# Patient Record
Sex: Male | Born: 1990 | Race: White | Hispanic: No | Marital: Married | Smoking: Never smoker
Health system: Southern US, Community
[De-identification: ages and names within clinical notes are randomized; demographics above are authoritative.]

---

## 2017-10-31 ENCOUNTER — Emergency Department (HOSPITAL_BASED_OUTPATIENT_CLINIC_OR_DEPARTMENT_OTHER): Payer: BLUE CROSS/BLUE SHIELD

## 2017-10-31 ENCOUNTER — Emergency Department (HOSPITAL_BASED_OUTPATIENT_CLINIC_OR_DEPARTMENT_OTHER)
Admission: EM | Admit: 2017-10-31 | Discharge: 2017-10-31 | Disposition: A | Discharge status: Home / Self Care | Payer: BLUE CROSS/BLUE SHIELD | Attending: Emergency Medicine | Admitting: Emergency Medicine

## 2017-10-31 ENCOUNTER — Encounter (HOSPITAL_BASED_OUTPATIENT_CLINIC_OR_DEPARTMENT_OTHER): Payer: Self-pay | Admitting: *Deleted

## 2017-10-31 ENCOUNTER — Other Ambulatory Visit: Payer: Self-pay

## 2017-10-31 DIAGNOSIS — R079 Chest pain, unspecified: Secondary | ICD-10-CM | POA: Insufficient documentation

## 2017-10-31 LAB — CBC WITH DIFFERENTIAL/PLATELET
BASOS PCT: 1 %
Basophils Absolute: 0 10*3/uL (ref 0.0–0.1)
EOS ABS: 0.1 10*3/uL (ref 0.0–0.7)
EOS PCT: 1 %
HCT: 39.7 % (ref 39.0–52.0)
Hemoglobin: 13.6 g/dL (ref 13.0–17.0)
LYMPHS ABS: 2.2 10*3/uL (ref 0.7–4.0)
Lymphocytes Relative: 30 %
MCH: 26.7 pg (ref 26.0–34.0)
MCHC: 34.3 g/dL (ref 30.0–36.0)
MCV: 78 fL (ref 78.0–100.0)
MONO ABS: 0.9 10*3/uL (ref 0.1–1.0)
MONOS PCT: 13 %
Neutro Abs: 4 10*3/uL (ref 1.7–7.7)
Neutrophils Relative %: 55 %
PLATELETS: 255 10*3/uL (ref 150–400)
RBC: 5.09 MIL/uL (ref 4.22–5.81)
RDW: 12.8 % (ref 11.5–15.5)
WBC: 7.2 10*3/uL (ref 4.0–10.5)

## 2017-10-31 LAB — BASIC METABOLIC PANEL
Anion gap: 8 (ref 5–15)
BUN: 14 mg/dL (ref 6–20)
CALCIUM: 8.9 mg/dL (ref 8.9–10.3)
CO2: 25 mmol/L (ref 22–32)
Chloride: 103 mmol/L (ref 101–111)
Creatinine, Ser: 1.08 mg/dL (ref 0.61–1.24)
Glucose, Bld: 88 mg/dL (ref 65–99)
Potassium: 3.8 mmol/L (ref 3.5–5.1)
SODIUM: 136 mmol/L (ref 135–145)

## 2017-10-31 LAB — TROPONIN I

## 2017-10-31 MED ORDER — FAMOTIDINE 20 MG PO TABS: 20.0000 mg | ORAL_TABLET | Freq: Two times a day (BID) | ORAL | 0 refills | Status: AC

## 2017-10-31 NOTE — ED Triage Notes (Addendum)
Pt c/o left sided chest pain, dizziness " off and on" x 2 days

## 2017-10-31 NOTE — Discharge Instructions (Signed)
Please read and follow all provided instructions.  Your diagnoses today include:  1. Nonspecific chest pain     Tests performed today include:  An EKG of your heart  A chest x-ray  Cardiac enzymes - a blood test for heart muscle damage  Blood counts and electrolytes  Vital signs. See below for your results today.   Medications prescribed:   Pepcid (famotidine) - antihistamine  You can find this medication over-the-counter.   DO NOT exceed:   20mg  Pepcid every 12 hours  Take any prescribed medications only as directed.  Follow-up instructions: Please follow-up with your primary care provider in 1 week if symptoms are not improved.   Return instructions:  SEEK IMMEDIATE MEDICAL ATTENTION IF:  You have severe chest pain, especially if the pain is crushing or pressure-like and spreads to the arms, back, neck, or jaw, or if you have sweating, nausea (feeling sick to your stomach), or shortness of breath. THIS IS AN EMERGENCY. Don't wait to see if the pain will go away. Get medical help at once. Call 911 or [phone] (operator). DO NOT drive yourself to the hospital.   Your chest pain gets worse and does not go away with rest.   You have an attack of chest pain lasting longer than usual, despite rest and treatment with the medications your caregiver has prescribed.   You wake from sleep with chest pain or shortness of breath.  You feel dizzy or faint.  You have chest pain not typical of your usual pain for which you originally saw your caregiver.   You have any other emergent concerns regarding your health.  Additional Information: Chest pain comes from many different causes. Your caregiver has diagnosed you as having chest pain that is not specific for one problem, but does not require admission.  You are at low risk for an acute heart condition or other serious illness.   Your vital signs today were: BP (!) 107/54 (BP Location: Left Arm)    Pulse 62    Temp 98.2 F (36.8 C)  (Oral)    Resp 18    Ht 5\' 7"  (1.702 m)    Wt 90.7 kg (200 lb)    SpO2 99%    BMI 31.32 kg/m  If your blood pressure (BP) was elevated above 135/85 this visit, please have this repeated by your doctor within one month. --------------

## 2017-10-31 NOTE — ED Provider Notes (Signed)
[facility] EMERGENCY DEPARTMENT Provider Note   CSN: [id number] Arrival date & time: 10/31/17  1253     History   Chief Complaint Chief Complaint  Patient presents with  . Chest Pain    HPI Cody Decker is a 27 y.o. male.  Patient presents with complaint of intermittent episodes of chest pain over the past 2 days, acute onset, lasting several minutes prior to spontaneous resolution.  Patient describes the pain as a severe sharp pain in the left chest that lasts for several seconds followed by pressure that lasts for several minutes.  Symptoms occur at rest and at any time.  No association with food.  No symptoms like this in the past.  He has had some dizziness and shortness of breath when the pain is severe.  No syncope.  No fevers or cough.  No recent injuries but patient does lift weights and does cardio exercises.  He has not had symptoms with exercise.  No nausea, vomiting, diarrhea.  No abdominal pains.  No lower extremity swelling or history no history of hypertension, diabetes, high cholesterol, smoking.  Family history of coronary artery disease on father's side.  Nothing makes symptoms better or worse.      History reviewed. No pertinent past medical history.  There are no active problems to display for this patient.   History reviewed. No pertinent surgical history.     Home Medications    Prior to Admission medications   Not on File    Family History History reviewed. No pertinent family history.  Social History Social History   Tobacco Use  . Smoking status: Never Smoker  . Smokeless tobacco: Never Used  Substance Use Topics  . Alcohol use: No    Frequency: Never  . Drug use: No     Allergies   Patient has no known allergies.   Review of Systems Review of Systems  Constitutional: Negative for diaphoresis and fever.  Eyes: Negative for redness.  Respiratory: Positive for shortness of breath. Negative for cough.   Cardiovascular:  Positive for chest pain. Negative for palpitations and leg swelling.  Gastrointestinal: Negative for abdominal pain, nausea and vomiting.  Genitourinary: Negative for dysuria.  Musculoskeletal: Negative for back pain and neck pain.  Skin: Negative for rash.  Neurological: Positive for dizziness. Negative for syncope and light-headedness.  Psychiatric/Behavioral: The patient is not nervous/anxious.      Physical Exam Updated Vital Signs BP 121/84   Pulse 78   Temp 98.2 F (36.8 C) (Oral)   Resp 16   Ht 5\' 7"  (1.702 m)   Wt 90.7 kg (200 lb)   SpO2 99%   BMI 31.32 kg/m   Physical Exam  Constitutional: He appears well-developed and well-nourished.  HENT:  Head: Normocephalic and atraumatic.  Mouth/Throat: Mucous membranes are normal. Mucous membranes are not dry.  Eyes: Conjunctivae are normal.  Neck: Trachea normal and normal range of motion. Neck supple. Normal carotid pulses and no JVD present. No muscular tenderness present. Carotid bruit is not present. No tracheal deviation present.  Cardiovascular: Normal rate, regular rhythm, S1 normal, S2 normal, normal heart sounds and intact distal pulses. Exam reveals no distant heart sounds and no decreased pulses.  No murmur heard. Pulmonary/Chest: Effort normal and breath sounds normal. No respiratory distress. He has no wheezes. He has no rhonchi. He has no rales. He exhibits no tenderness.  No reproducible chest wall tenderness.  Abdominal: Soft. Normal aorta and bowel sounds are normal. There is no  tenderness. There is no rebound and no guarding.  Musculoskeletal: He exhibits no edema.       Right lower leg: He exhibits no tenderness and no edema.       Left lower leg: He exhibits no tenderness and no edema.  Neurological: He is alert.  Skin: Skin is warm and dry. He is not diaphoretic. No cyanosis. No pallor.  Psychiatric: He has a normal mood and affect.  Nursing note and vitals reviewed.    ED Treatments / Results   Labs (all labs ordered are listed, but only abnormal results are displayed) Labs Reviewed  CBC WITH DIFFERENTIAL/PLATELET  BASIC METABOLIC PANEL  TROPONIN I    EKG  EKG Interpretation  Date/Time:  Thursday October 31 2017 13:03:03 EST Ventricular Rate:  78 PR Interval:  144 QRS Duration: 94 QT Interval:  362 QTC Calculation: 412 R Axis:   -19 Text Interpretation:  Normal sinus rhythm Normal ECG Confirmed by Cathren LaineSteinl, Kevin ([id number]) on 10/31/2017 1:43:32 PM       Radiology Dg Chest 2 View  Result Date: 10/31/2017 CLINICAL DATA:  Chest pain. EXAM: CHEST  2 VIEW COMPARISON:  No prior. FINDINGS: Mediastinum and hilar structures normal. Lungs are clear. Heart size normal. No pleural effusion or pneumothorax. No acute bony abnormality. IMPRESSION: No acute cardiopulmonary disease. Electronically Signed   By: Maisie Fushomas  Register   On: 10/31/2017 13:43    Procedures Procedures (including critical care time)  Medications Ordered in ED Medications - No data to display   Initial Impression / Assessment and Plan / ED Course  I have reviewed the triage vital signs and the nursing notes.  Pertinent labs & imaging results that were available during my care of the patient were reviewed by me and considered in my medical decision making (see chart for details).     Patient seen and examined. Work-up initiated.  EKG reviewed.  No concern for ACS or PE.    Vital signs reviewed and are as follows: BP 121/84   Pulse 78   Temp 98.2 F (36.8 C) (Oral)   Resp 16   Ht 5\' 7"  (1.702 m)   Wt 90.7 kg (200 lb)   SpO2 99%   BMI 31.32 kg/m   4:16 PM patient updated on remainder of workup.  Lab work is reassuring.  PCP follow-up for recheck.  Plan is to treat empirically with Pepcid to see if this helps current episodic pain.    Encouraged return to the emergency department with worsening pain, especially if it lasts longer, worsening shortness of breath or trouble breathing, fever, new symptoms  or other concerns.  Patient verbalizes understanding and agrees with the plan.  The patient verbalized understanding and agreed.    Final Clinical Impressions(s) / ED Diagnoses   Final diagnoses:  Nonspecific chest pain   Patient with atypical short-lived episodes of left-sided chest pain.  Troponin negative.  EKG is nonischemic.  Chest x-ray reassuring without cardiomegaly, pneumonia, pneumothorax, or other concerning finding.  Pain is currently resolved.  May be GI or musculoskeletal in etiology.  Do not suspect pericarditis or myocarditis.  Patient is well-appearing, stable for discharge home with close PCP follow-up.  Return instructions as above.  ED Discharge Orders        Ordered    famotidine (PEPCID) 20 MG tablet  2 times daily     10/31/17 1557       Renne CriglerGeiple, Hugh, PA-C 10/31/17 1618    Cathren LaineSteinl, Kevin, MD 11/01/17 1259

## 2018-11-13 IMAGING — DX DG CHEST 2V
2 series · 2 of 2 positions shown · non-contrast
Comparison: No prior.

CLINICAL DATA: Chest pain.

EXAM:
CHEST  2 VIEW

[chest pa]
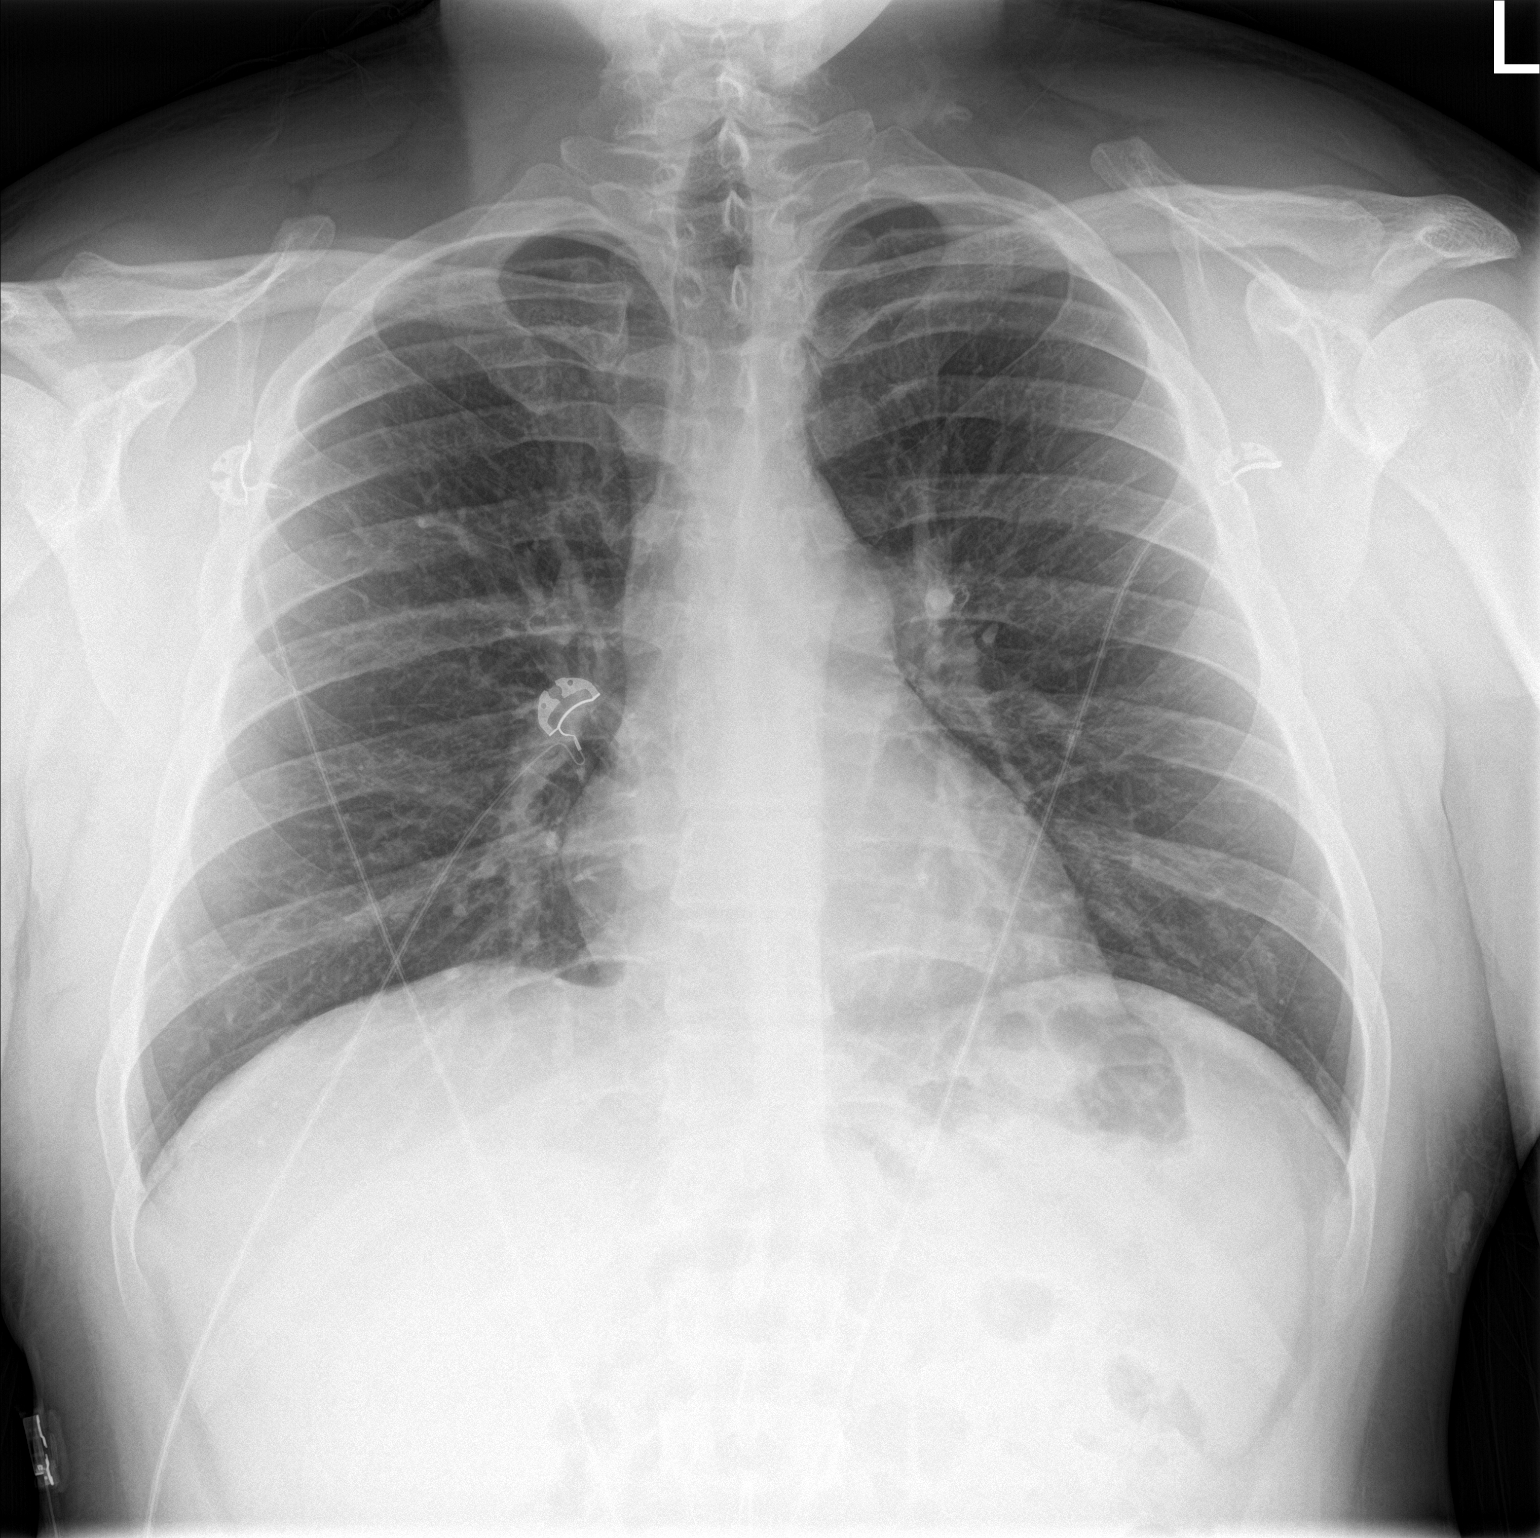

[chest lat]
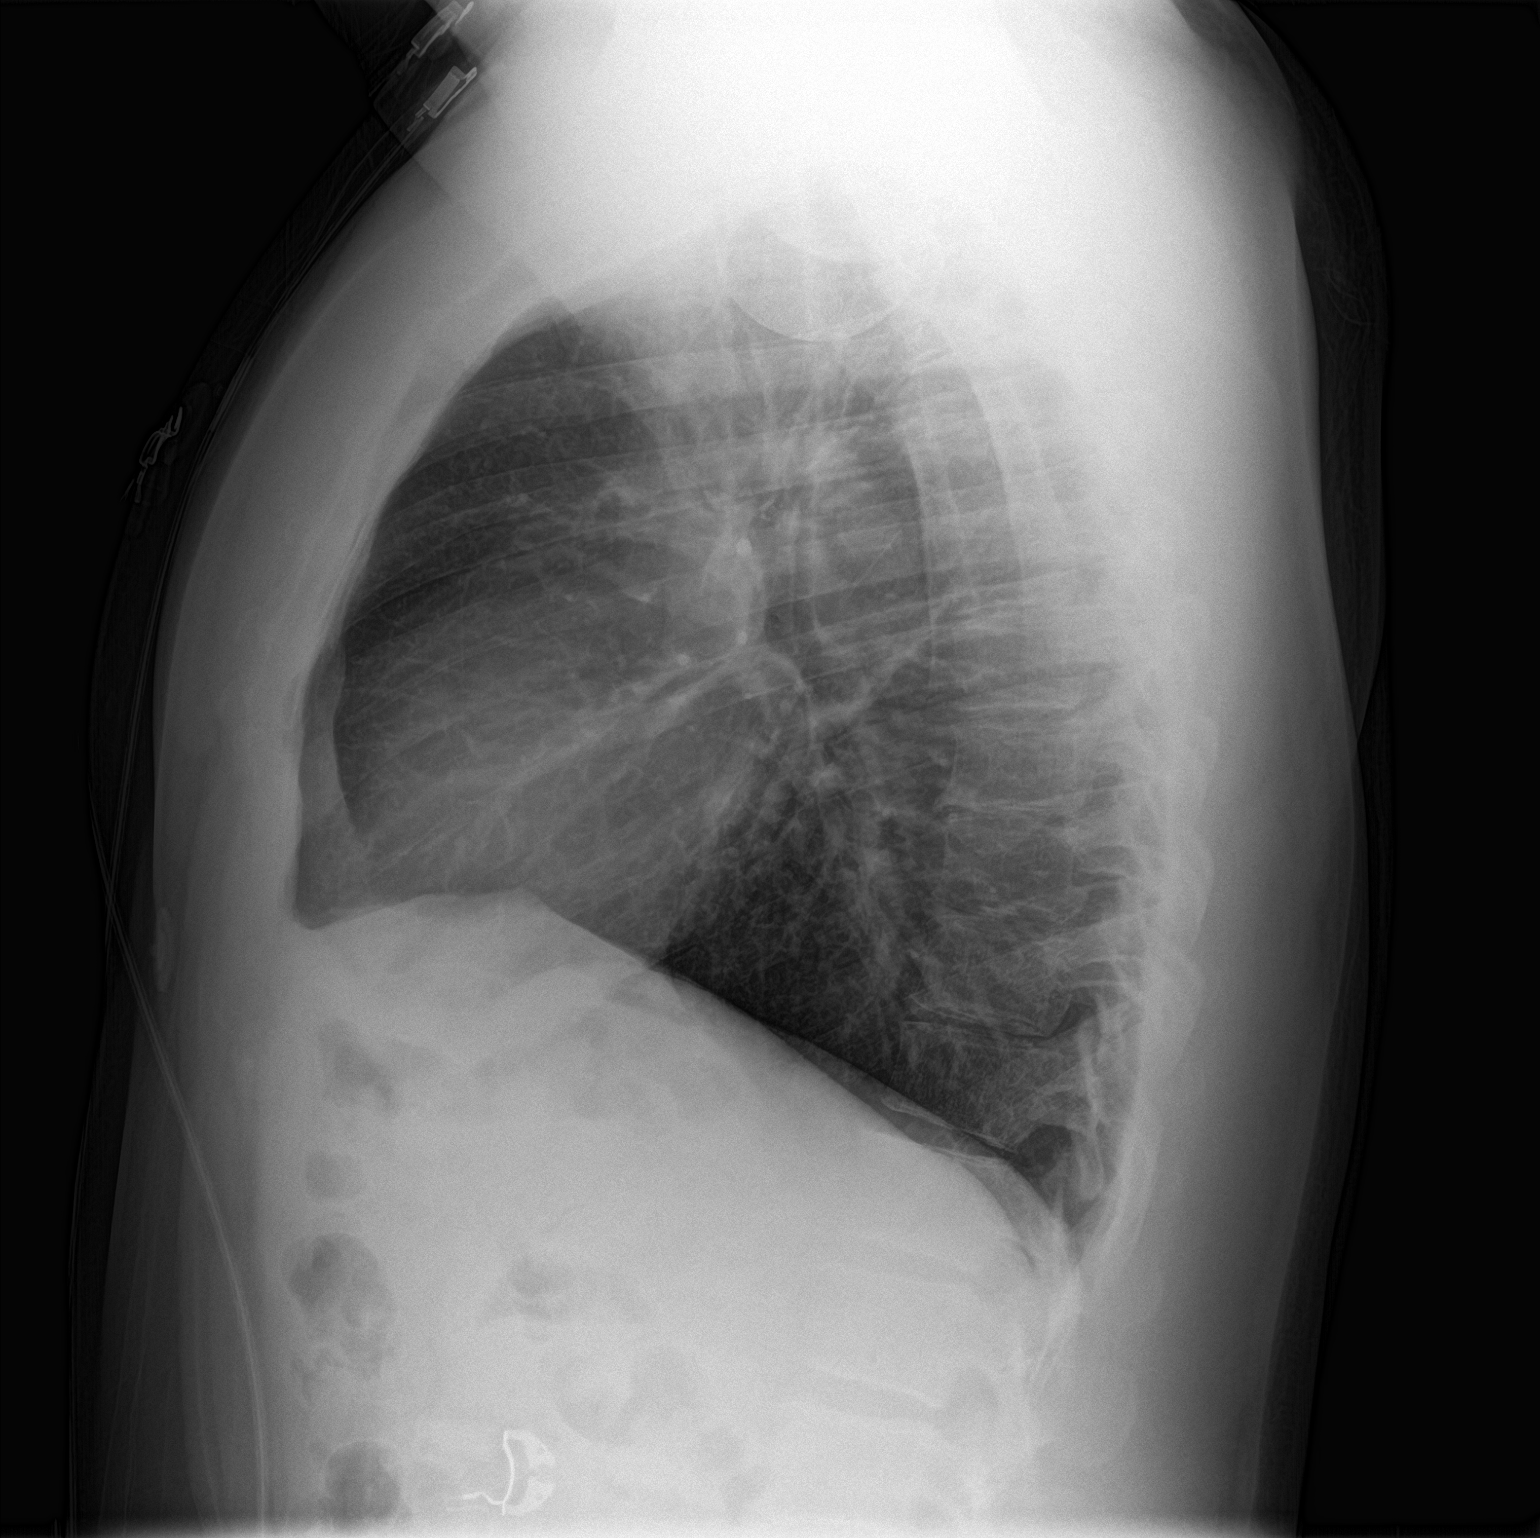

[2 of 2 positions shown; findings below may reference images not displayed]

FINDINGS: Mediastinum and hilar structures normal. Lungs are clear. Heart size
normal. No pleural effusion or pneumothorax. No acute bony
abnormality.
IMPRESSION: No acute cardiopulmonary disease.
